# Patient Record
Sex: Male | Born: 1955 | Race: White | Hispanic: No | Marital: Married | Smoking: Never smoker
Health system: Southern US, Community
[De-identification: ages and names within clinical notes are randomized; demographics above are authoritative.]

## PROBLEM LIST (undated history)

## (undated) DIAGNOSIS — I10 Essential (primary) hypertension: Secondary | ICD-10-CM

## (undated) HISTORY — PX: COLONOSCOPY: SHX174

## (undated) HISTORY — PX: WISDOM TOOTH EXTRACTION: SHX21

---

## 2009-03-16 ENCOUNTER — Ambulatory Visit: Payer: Self-pay | Admitting: Gastroenterology

## 2009-10-26 ENCOUNTER — Emergency Department: Payer: Self-pay | Admitting: Emergency Medicine
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
## 2019-12-24 ENCOUNTER — Ambulatory Visit: Payer: Self-pay | Admitting: General Surgery

## 2019-12-24 ENCOUNTER — Other Ambulatory Visit: Payer: Self-pay

## 2019-12-24 ENCOUNTER — Encounter
Admission: RE | Admit: 2019-12-24 | Discharge: 2019-12-24 | Disposition: A | Discharge status: Home / Self Care | Payer: Commercial Managed Care - PPO | Source: Ambulatory Visit | Attending: General Surgery | Admitting: General Surgery

## 2019-12-24 HISTORY — DX: Essential (primary) hypertension: I10

## 2019-12-24 NOTE — H&P (Signed)
PATIENT PROFILE: Joseph Short is a 64 y.o. male who presents to the Clinic for consultation at the request of Dr. Baldemar Lenis for evaluation of left inguinal hernia.  PCP: Barnabas Lister, MD  HISTORY OF PRESENT ILLNESS: Joseph Short reports feeling a left inguinal hernia since November 2020. He reported that he was doing heavy lifting and he felt pain on the left groin. Since then he has been feeling pain and burning on the left groin. He also feels a bulge that comes in and out. Pain is aggravated by heavy lifting. Alleviating factors resting. There is no pain radiation. Patient denies any abdominal distention nausea or vomiting. Patient having regular bowel movements. Patient tolerating diet.  PROBLEM LIST: Problem List Date Reviewed: 12/05/2019  Noted  Cerebral amyloid angiopathy (CMS-HCC) 05/03/2018  Essential hypertension, benign 09/26/2013  Other and unspecified hyperlipidemia 09/26/2013    GENERAL REVIEW OF SYSTEMS:   General ROS: negative for - chills, fatigue, fever, weight gain or weight loss Allergy and Immunology ROS: negative for - hives  Hematological and Lymphatic ROS: negative for - bleeding problems or bruising, negative for palpable nodes Endocrine ROS: negative for - heat or cold intolerance, hair changes Respiratory ROS: negative for - cough, shortness of breath or wheezing Cardiovascular ROS: no chest pain or palpitations GI ROS: negative for nausea, vomiting, abdominal pain, diarrhea, constipation Musculoskeletal ROS: negative for - joint swelling or muscle pain Neurological ROS: negative for - confusion, syncope Dermatological ROS: negative for pruritus and rash Psychiatric: negative for anxiety, depression, difficulty sleeping and memory loss  MEDICATIONS: Current Outpatient Medications  Medication Sig Dispense Refill  . carBAMazepine (TEGRETOL XR) 400 MG XR tablet Take 1 tablet (400 mg total) by mouth 2 (two) times daily 60 tablet 11  . lisinopriL (ZESTRIL) 20  MG tablet TAKE 1 TABLET BY MOUTH EVERY DAY 90 tablet 3  . simvastatin (ZOCOR) 20 MG tablet TAKE 1 TABLET BY MOUTH EVERY DAY AT NIGHT 90 tablet 3   No current facility-administered medications for this visit.   ALLERGIES: Other  PAST MEDICAL HISTORY: Past Medical History:  Diagnosis Date  . Hyperlipidemia  . Hypertension   PAST SURGICAL HISTORY: Past Surgical History:  Procedure Laterality Date  . COLONOSCOPY 03/16/2009  Dr. Kurtis Bushman @ [place]: History reviewed. No pertinent family history.   SOCIAL HISTORY: Social History   Socioeconomic History  . Marital status: Married  Spouse name: Not on file  . Number of children: Not on file  . Years of education: Not on file  . Highest education level: Not on file  Occupational History  . Not on file  Tobacco Use  . Smoking status: Never Smoker  . Smokeless tobacco: Never Used  Substance and Sexual Activity  . Alcohol use: Not on file  . Drug use: Not on file  . Sexual activity: Not on file  Other Topics Concern  . Not on file  Social History Narrative  . Not on file   Social Determinants of Health   Financial Resource Strain:  . Difficulty of Paying Living Expenses:  Food Insecurity:  . Worried About Charity fundraiser in the Last Year:  . Arboriculturist in the Last Year:  Transportation Needs:  . Film/video editor (Medical):  Marland Kitchen Lack of Transportation (Non-Medical):   PHYSICAL EXAM: Vitals:  12/19/19 1523  BP: (!) 154/91  Pulse: 70   Body mass index is 28.98 kg/m. Weight: 91.6 kg (202 lb)  GENERAL: Alert, active, oriented x3  HEENT: Pupils equal reactive to light. Extraocular movements are intact. Sclera clear. Palpebral conjunctiva normal red color.Pharynx clear.  NECK: Supple with no palpable mass and no adenopathy.  LUNGS: Sound clear with no rales rhonchi or wheezes.  HEART: Regular rhythm S1 and S2 without murmur.  ABDOMEN: Soft and depressible, nontender with  no palpable mass, no hepatomegaly. There is a palpable hernia on the left groin, reducible. Mild pain to reduction. No hernia palpated on the right groin but mildly tender to palpation as well.  EXTREMITIES: Well-developed well-nourished symmetrical with no dependent edema.  NEUROLOGICAL: Awake alert oriented, facial expression symmetrical, moving all extremities.  REVIEW OF DATA: I have reviewed the following data today: Appointment on 11/28/2019  Component Date Value  . WBC (White Blood Cell Co* 11/28/2019 4.1  . RBC (Red Blood Cell Coun* 11/28/2019 5.09  . Hemoglobin 11/28/2019 15.4  . Hematocrit 11/28/2019 45.5  . MCV (Mean Corpuscular Vo* 11/28/2019 89.4  . MCH (Mean Corpuscular He* 11/28/2019 30.3  . MCHC (Mean Corpuscular H* 11/28/2019 33.8  . Platelet Count 11/28/2019 237  . RDW-CV (Red Cell Distrib* 11/28/2019 11.7  . MPV (Mean Platelet Volum* 11/28/2019 9.2*  . Neutrophils 11/28/2019 2.20  . Lymphocytes 11/28/2019 1.30  . Mixed Count 11/28/2019 0.60  . Neutrophil % 11/28/2019 54.9  . Lymphocyte % 11/28/2019 30.6  . Mixed % 11/28/2019 14.5*  . Glucose 11/28/2019 95  . Sodium 11/28/2019 139  . Potassium 11/28/2019 4.7  . Chloride 11/28/2019 104  . Carbon Dioxide (CO2) 11/28/2019 28.3  . Urea Nitrogen (BUN) 11/28/2019 19  . Creatinine 11/28/2019 1.0  . Glomerular Filtration Ra* 11/28/2019 75  . Calcium 11/28/2019 9.4  . AST 11/28/2019 19  . ALT 11/28/2019 21  . Alk Phos (alkaline Phosp* 11/28/2019 100  . Albumin 11/28/2019 4.5  . Bilirubin, Total 11/28/2019 0.6  . Protein, Total 11/28/2019 7.0  . A/G Ratio 11/28/2019 1.8  . Cholesterol, Total 11/28/2019 225*  . Triglyceride 11/28/2019 103  . HDL (High Density Lipopr* 11/28/2019 50.7  . LDL Calculated 11/28/2019 154*  . VLDL Cholesterol 11/28/2019 21  . Cholesterol/HDL Ratio 11/28/2019 4.4  . PSA (Prostate Specific A* 11/28/2019 0.62    ASSESSMENT: Joseph Short is a 64 y.o. male presenting for consultation for  left inguinal hernia.   The patient presents with a symptomatic, reducible inguinal hernia. Patient was oriented about the diagnosis of inguinal hernia and its implication. The patient was oriented about the treatment alternatives (observation vs surgical repair). Due to patient symptoms, repair is recommended. Patient oriented about the surgical procedure, the use of mesh and its risk of complications such as: infection, bleeding, injury to vas deference, vasculature and testicle, injury to bowel or bladder, and chronic pain.   Long discussion with the patient about the possibility of having bilateral hernia. I considered that if hernia is found Intra-Op it would be recommended to repair at the same time. Patient agreed with the recommendation and consented for right inguinal hernia repair if identified Intra-Op.  Non-recurrent unilateral inguinal hernia without obstruction or gangrene [K40.90]  PLAN: 1. Robotic assisted laparoscopic left vs bilateral inguinal hernia repair with mesh (70962) 2. CBC, CMP (done) 3. Avoid taking aspirin 5 days before procedure 4. Neurology Clearance 5. Contact us if has any question or concern.  Patient verbalized understanding, all questions were answered, and were agreeable with the plan outlined above.   Herbert Pun, MD  Electronically signed by Herbert Pun, MD

## 2019-12-24 NOTE — H&P (View-Only) (Signed)
PATIENT PROFILE: Joseph Short is a 64 y.o. male who presents to the Clinic for consultation at the request of Dr. Baldemar Lenis for evaluation of left inguinal hernia.  PCP: Barnabas Lister, MD  HISTORY OF PRESENT ILLNESS: [name] reports feeling a left inguinal hernia since November 2020. He reported that he was doing heavy lifting and he felt pain on the left groin. Since then he has been feeling pain and burning on the left groin. He also feels a bulge that comes in and out. Pain is aggravated by heavy lifting. Alleviating factors resting. There is no pain radiation. Patient denies any abdominal distention nausea or vomiting. Patient having regular bowel movements. Patient tolerating diet.  PROBLEM LIST: Problem List Date Reviewed: 12/05/2019  Noted  Cerebral amyloid angiopathy (CMS-HCC) 05/03/2018  Essential hypertension, benign 09/26/2013  Other and unspecified hyperlipidemia 09/26/2013    GENERAL REVIEW OF SYSTEMS:   General ROS: negative for - chills, fatigue, fever, weight gain or weight loss Allergy and Immunology ROS: negative for - hives  Hematological and Lymphatic ROS: negative for - bleeding problems or bruising, negative for palpable nodes Endocrine ROS: negative for - heat or cold intolerance, hair changes Respiratory ROS: negative for - cough, shortness of breath or wheezing Cardiovascular ROS: no chest pain or palpitations GI ROS: negative for nausea, vomiting, abdominal pain, diarrhea, constipation Musculoskeletal ROS: negative for - joint swelling or muscle pain Neurological ROS: negative for - confusion, syncope Dermatological ROS: negative for pruritus and rash Psychiatric: negative for anxiety, depression, difficulty sleeping and memory loss  MEDICATIONS: Current Outpatient Medications  Medication Sig Dispense Refill  . carBAMazepine (TEGRETOL XR) 400 MG XR tablet Take 1 tablet (400 mg total) by mouth 2 (two) times daily 60 tablet 11  . lisinopriL (ZESTRIL) 20  MG tablet TAKE 1 TABLET BY MOUTH EVERY DAY 90 tablet 3  . simvastatin (ZOCOR) 20 MG tablet TAKE 1 TABLET BY MOUTH EVERY DAY AT NIGHT 90 tablet 3   No current facility-administered medications for this visit.   ALLERGIES: Other  PAST MEDICAL HISTORY: Past Medical History:  Diagnosis Date  . Hyperlipidemia  . Hypertension   PAST SURGICAL HISTORY: Past Surgical History:  Procedure Laterality Date  . COLONOSCOPY 03/16/2009  Dr. Kurtis Bushman @ [place]: History reviewed. No pertinent family history.   SOCIAL HISTORY: Social History   Socioeconomic History  . Marital status: Married  Spouse name: Not on file  . Number of children: Not on file  . Years of education: Not on file  . Highest education level: Not on file  Occupational History  . Not on file  Tobacco Use  . Smoking status: Never Smoker  . Smokeless tobacco: Never Used  Substance and Sexual Activity  . Alcohol use: Not on file  . Drug use: Not on file  . Sexual activity: Not on file  Other Topics Concern  . Not on file  Social History Narrative  . Not on file   Social Determinants of Health   Financial Resource Strain:  . Difficulty of Paying Living Expenses:  Food Insecurity:  . Worried About Charity fundraiser in the Last Year:  . Arboriculturist in the Last Year:  Transportation Needs:  . Film/video editor (Medical):  Marland Kitchen Lack of Transportation (Non-Medical):   PHYSICAL EXAM: Vitals:  12/19/19 1523  BP: (!) 154/91  Pulse: 70   Body mass index is 28.98 kg/m. Weight: 91.6 kg (202 lb)  GENERAL: Alert, active, oriented x3  HEENT: Pupils equal reactive to light. Extraocular movements are intact. Sclera clear. Palpebral conjunctiva normal red color.Pharynx clear.  NECK: Supple with no palpable mass and no adenopathy.  LUNGS: Sound clear with no rales rhonchi or wheezes.  HEART: Regular rhythm S1 and S2 without murmur.  ABDOMEN: Soft and depressible, nontender with  no palpable mass, no hepatomegaly. There is a palpable hernia on the left groin, reducible. Mild pain to reduction. No hernia palpated on the right groin but mildly tender to palpation as well.  EXTREMITIES: Well-developed well-nourished symmetrical with no dependent edema.  NEUROLOGICAL: Awake alert oriented, facial expression symmetrical, moving all extremities.  REVIEW OF DATA: I have reviewed the following data today: Appointment on 11/28/2019  Component Date Value  . WBC (White Blood Cell Co* 11/28/2019 4.1  . RBC (Red Blood Cell Coun* 11/28/2019 5.09  . Hemoglobin 11/28/2019 15.4  . Hematocrit 11/28/2019 45.5  . MCV (Mean Corpuscular Vo* 11/28/2019 89.4  . MCH (Mean Corpuscular He* 11/28/2019 30.3  . MCHC (Mean Corpuscular H* 11/28/2019 33.8  . Platelet Count 11/28/2019 237  . RDW-CV (Red Cell Distrib* 11/28/2019 11.7  . MPV (Mean Platelet Volum* 11/28/2019 9.2*  . Neutrophils 11/28/2019 2.20  . Lymphocytes 11/28/2019 1.30  . Mixed Count 11/28/2019 0.60  . Neutrophil % 11/28/2019 54.9  . Lymphocyte % 11/28/2019 30.6  . Mixed % 11/28/2019 14.5*  . Glucose 11/28/2019 95  . Sodium 11/28/2019 139  . Potassium 11/28/2019 4.7  . Chloride 11/28/2019 104  . Carbon Dioxide (CO2) 11/28/2019 28.3  . Urea Nitrogen (BUN) 11/28/2019 19  . Creatinine 11/28/2019 1.0  . Glomerular Filtration Ra* 11/28/2019 75  . Calcium 11/28/2019 9.4  . AST 11/28/2019 19  . ALT 11/28/2019 21  . Alk Phos (alkaline Phosp* 11/28/2019 100  . Albumin 11/28/2019 4.5  . Bilirubin, Total 11/28/2019 0.6  . Protein, Total 11/28/2019 7.0  . A/G Ratio 11/28/2019 1.8  . Cholesterol, Total 11/28/2019 225*  . Triglyceride 11/28/2019 103  . HDL (High Density Lipopr* 11/28/2019 50.7  . LDL Calculated 11/28/2019 154*  . VLDL Cholesterol 11/28/2019 21  . Cholesterol/HDL Ratio 11/28/2019 4.4  . PSA (Prostate Specific A* 11/28/2019 0.62    ASSESSMENT: [name] is a 64 y.o. male presenting for consultation for  left inguinal hernia.   The patient presents with a symptomatic, reducible inguinal hernia. Patient was oriented about the diagnosis of inguinal hernia and its implication. The patient was oriented about the treatment alternatives (observation vs surgical repair). Due to patient symptoms, repair is recommended. Patient oriented about the surgical procedure, the use of mesh and its risk of complications such as: infection, bleeding, injury to vas deference, vasculature and testicle, injury to bowel or bladder, and chronic pain.   Long discussion with the patient about the possibility of having bilateral hernia. I considered that if hernia is found Intra-Op it would be recommended to repair at the same time. Patient agreed with the recommendation and consented for right inguinal hernia repair if identified Intra-Op.  Non-recurrent unilateral inguinal hernia without obstruction or gangrene [K40.90]  PLAN: 1. Robotic assisted laparoscopic left vs bilateral inguinal hernia repair with mesh ([id number]) 2. CBC, CMP (done) 3. Avoid taking aspirin 5 days before procedure 4. Neurology Clearance 5. Contact us if has any question or concern.  Patient verbalized understanding, all questions were answered, and were agreeable with the plan outlined above.   Herbert Pun, MD  Electronically signed by Herbert Pun, MD

## 2019-12-24 NOTE — Patient Instructions (Signed)
Your procedure is scheduled on: Friday December 27, 2019. Report to Day Surgery inside [facility] 2nd floor. To find out your arrival time please call [phone] between 1PM - 3PM on Thursday December 26, 2019.  Remember: Instructions that are not followed completely may result in serious medical risk,  up to and including death, or upon the discretion of your surgeon and anesthesiologist your  surgery may need to be rescheduled.     _X__ 1. Do not eat food after midnight the night before your procedure.                 No chewing gum or hard candies. You may drink clear liquids up to 2 hours                 before you are scheduled to arrive for your surgery- DO not drink clear                 liquids within 2 hours of the start of your surgery.                 Clear Liquids include:  water, apple juice without pulp, clear Gatorade, G2 or                  Gatorade Zero (avoid Red/Purple/Blue), Black Coffee or Tea (Do not add                 anything to coffee or tea).  __X__2.  On the morning of surgery brush your teeth with toothpaste and water, you                may rinse your mouth with mouthwash if you wish.  Do not swallow any toothpaste of mouthwash.     _X__ 3.  No Alcohol for 24 hours before or after surgery.   _X__ 4.  Do Not Smoke or use e-cigarettes For 24 Hours Prior to Your Surgery.                 Do not use any chewable tobacco products for at least 6 hours prior to                 Surgery.  _X__  5.  Do not use any recreational drugs (marijuana, cocaine, heroin, ecstasy, MDMA or other)                For at least one week prior to your surgery.  Combination of these drugs with anesthesia                May have life threatening results.  __x__ 6.  Notify your doctor if there is any change in your medical condition      (cold, fever, infections).     Do not wear jewelry, make-up, hairpins, clips or nail polish. Do not wear lotions,  powders, or perfumes. You may wear deodorant. Do not shave 48 hours prior to surgery. Men may shave face and neck. Do not bring valuables to the hospital.    [facility] is not responsible for any belongings or valuables.  Contacts, dentures or bridgework may not be worn into surgery. Leave your suitcase in the car. After surgery it may be brought to your room. For patients admitted to the hospital, discharge time is determined by your treatment team.  Patients discharged the day of surgery will not be allowed to drive home.   Make arrangements for someone to be with you for the first 24 hours of your Same Day Discharge.    __x__ Take these medicines the morning of surgery with A SIP OF WATER:    1.  carbamazepine (TEGRETOL XR) 400 MG   __x__ Use CHG Soap as directed  __x__ Stop Anti-inflammatories such as Ibuprofen, Aleve, naproxen, aspirin and or BC powders.    __x__ Stop supplements until after surgery.    __x__ Do not start any herbal supplements before your procedure.    If you have any questions regarding your pre-procedure instructions,  Please call Pre-admit Testing at [phone].

## 2019-12-25 ENCOUNTER — Other Ambulatory Visit: Payer: Commercial Managed Care - PPO

## 2019-12-25 ENCOUNTER — Encounter
Admission: RE | Admit: 2019-12-25 | Discharge: 2019-12-25 | Disposition: A | Discharge status: Home / Self Care | Payer: Commercial Managed Care - PPO | Source: Ambulatory Visit | Attending: General Surgery | Admitting: General Surgery

## 2019-12-25 DIAGNOSIS — Z20822 Contact with and (suspected) exposure to covid-19: Secondary | ICD-10-CM | POA: Insufficient documentation

## 2019-12-25 DIAGNOSIS — Z01818 Encounter for other preprocedural examination: Secondary | ICD-10-CM | POA: Insufficient documentation

## 2019-12-25 LAB — SARS CORONAVIRUS 2 (TAT 6-24 HRS): SARS Coronavirus 2: NEGATIVE

## 2019-12-27 ENCOUNTER — Ambulatory Visit: Payer: Commercial Managed Care - PPO | Admitting: Anesthesiology

## 2019-12-27 ENCOUNTER — Encounter
Admission: RE | Disposition: A | Discharge status: Home / Self Care | Payer: Self-pay | Source: Home / Self Care | Attending: General Surgery

## 2019-12-27 ENCOUNTER — Ambulatory Visit
Admission: RE | Admit: 2019-12-27 | Payer: Commercial Managed Care - PPO | Source: Home / Self Care | Admitting: General Surgery

## 2019-12-27 ENCOUNTER — Other Ambulatory Visit: Payer: Self-pay

## 2019-12-27 ENCOUNTER — Encounter: Payer: Self-pay | Admitting: General Surgery

## 2019-12-27 ENCOUNTER — Encounter: Admission: RE | Payer: Self-pay | Source: Home / Self Care

## 2019-12-27 ENCOUNTER — Ambulatory Visit
Admission: RE | Admit: 2019-12-27 | Discharge: 2019-12-27 | Disposition: A | Discharge status: Home / Self Care | Payer: Commercial Managed Care - PPO | Attending: General Surgery | Admitting: General Surgery

## 2019-12-27 DIAGNOSIS — K4020 Bilateral inguinal hernia, without obstruction or gangrene, not specified as recurrent: Secondary | ICD-10-CM | POA: Diagnosis not present

## 2019-12-27 DIAGNOSIS — D176 Benign lipomatous neoplasm of spermatic cord: Secondary | ICD-10-CM | POA: Insufficient documentation

## 2019-12-27 DIAGNOSIS — E785 Hyperlipidemia, unspecified: Secondary | ICD-10-CM | POA: Diagnosis not present

## 2019-12-27 DIAGNOSIS — I10 Essential (primary) hypertension: Secondary | ICD-10-CM | POA: Insufficient documentation

## 2019-12-27 DIAGNOSIS — I680 Cerebral amyloid angiopathy: Secondary | ICD-10-CM | POA: Insufficient documentation

## 2019-12-27 DIAGNOSIS — K4090 Unilateral inguinal hernia, without obstruction or gangrene, not specified as recurrent: Secondary | ICD-10-CM | POA: Diagnosis present

## 2019-12-27 DIAGNOSIS — E854 Organ-limited amyloidosis: Secondary | ICD-10-CM | POA: Insufficient documentation

## 2019-12-27 DIAGNOSIS — Z79899 Other long term (current) drug therapy: Secondary | ICD-10-CM | POA: Diagnosis not present

## 2019-12-27 HISTORY — PX: XI ROBOTIC ASSISTED INGUINAL HERNIA REPAIR WITH MESH: SHX6706

## 2019-12-27 SURGERY — REPAIR, HERNIA, INGUINAL, ROBOT-ASSISTED, LAPAROSCOPIC, USING MESH
Anesthesia: General | Site: Groin | Laterality: Bilateral

## 2019-12-27 MED ORDER — LIDOCAINE HCL (CARDIAC) PF 100 MG/5ML IV SOSY
PREFILLED_SYRINGE | INTRAVENOUS | Status: DC | PRN
  Administered 2019-12-27: 80 mg via INTRAVENOUS

## 2019-12-27 MED ORDER — KETOROLAC TROMETHAMINE 30 MG/ML IJ SOLN
INTRAMUSCULAR | Status: AC
  Filled 2019-12-27: qty 1

## 2019-12-27 MED ORDER — FAMOTIDINE 20 MG PO TABS
ORAL_TABLET | ORAL | Status: AC
  Administered 2019-12-27: 20 mg via ORAL
  Filled 2019-12-27: qty 1

## 2019-12-27 MED ORDER — CEFAZOLIN SODIUM-DEXTROSE 2-4 GM/100ML-% IV SOLN
INTRAVENOUS | Status: AC
  Filled 2019-12-27: qty 100

## 2019-12-27 MED ORDER — KETOROLAC TROMETHAMINE 30 MG/ML IJ SOLN
INTRAMUSCULAR | Status: DC | PRN
  Administered 2019-12-27: 30 mg via INTRAVENOUS

## 2019-12-27 MED ORDER — DEXAMETHASONE SODIUM PHOSPHATE 10 MG/ML IJ SOLN
INTRAMUSCULAR | Status: AC
  Filled 2019-12-27: qty 1

## 2019-12-27 MED ORDER — ONDANSETRON HCL 4 MG/2ML IJ SOLN: 4.0000 mg | Freq: Once | INTRAMUSCULAR | Status: DC | PRN

## 2019-12-27 MED ORDER — FENTANYL CITRATE (PF) 100 MCG/2ML IJ SOLN
INTRAMUSCULAR | Status: DC | PRN
Start: 2019-12-27 — End: 2019-12-27
  Administered 2019-12-27 (×2): 50 ug via INTRAVENOUS

## 2019-12-27 MED ORDER — ROCURONIUM BROMIDE 10 MG/ML (PF) SYRINGE
PREFILLED_SYRINGE | INTRAVENOUS | Status: AC
  Filled 2019-12-27: qty 10

## 2019-12-27 MED ORDER — ONDANSETRON HCL 4 MG/2ML IJ SOLN
INTRAMUSCULAR | Status: DC | PRN
  Administered 2019-12-27: 4 mg via INTRAVENOUS

## 2019-12-27 MED ORDER — CHLORHEXIDINE GLUCONATE 0.12 % MT SOLN: 15.0000 mL | Freq: Once | OROMUCOSAL | Status: AC

## 2019-12-27 MED ORDER — BUPIVACAINE-EPINEPHRINE (PF) 0.25% -1:200000 IJ SOLN
INTRAMUSCULAR | Status: AC
  Filled 2019-12-27: qty 30

## 2019-12-27 MED ORDER — ONDANSETRON HCL 4 MG/2ML IJ SOLN
INTRAMUSCULAR | Status: AC
  Filled 2019-12-27: qty 2

## 2019-12-27 MED ORDER — PROPOFOL 10 MG/ML IV BOLUS
INTRAVENOUS | Status: DC | PRN
  Administered 2019-12-27: 170 mg via INTRAVENOUS

## 2019-12-27 MED ORDER — PROPOFOL 10 MG/ML IV BOLUS
INTRAVENOUS | Status: AC
  Filled 2019-12-27: qty 20

## 2019-12-27 MED ORDER — HYDROCODONE-ACETAMINOPHEN 5-325 MG PO TABS
1.0000 | ORAL_TABLET | ORAL | 0 refills | Status: AC | PRN
Start: 2019-12-27 — End: 2019-12-30

## 2019-12-27 MED ORDER — EPHEDRINE 5 MG/ML INJ
INTRAVENOUS | Status: AC
  Filled 2019-12-27: qty 10

## 2019-12-27 MED ORDER — DEXMEDETOMIDINE HCL IN NACL 400 MCG/100ML IV SOLN
INTRAVENOUS | Status: DC | PRN
  Administered 2019-12-27: 8 ug via INTRAVENOUS
  Administered 2019-12-27 (×3): 4 ug via INTRAVENOUS

## 2019-12-27 MED ORDER — FAMOTIDINE 20 MG PO TABS: 20.0000 mg | ORAL_TABLET | Freq: Once | ORAL | Status: AC

## 2019-12-27 MED ORDER — DEXAMETHASONE SODIUM PHOSPHATE 10 MG/ML IJ SOLN
INTRAMUSCULAR | Status: DC | PRN
  Administered 2019-12-27: 10 mg via INTRAVENOUS

## 2019-12-27 MED ORDER — SUGAMMADEX SODIUM 200 MG/2ML IV SOLN
INTRAVENOUS | Status: DC | PRN
  Administered 2019-12-27: 200 mg via INTRAVENOUS

## 2019-12-27 MED ORDER — ACETAMINOPHEN 10 MG/ML IV SOLN
INTRAVENOUS | Status: AC
  Filled 2019-12-27: qty 100

## 2019-12-27 MED ORDER — ROCURONIUM BROMIDE 100 MG/10ML IV SOLN
INTRAVENOUS | Status: DC | PRN
  Administered 2019-12-27: 50 mg via INTRAVENOUS
  Administered 2019-12-27 (×2): 20 mg via INTRAVENOUS
  Administered 2019-12-27: 30 mg via INTRAVENOUS
  Administered 2019-12-27 (×2): 20 mg via INTRAVENOUS

## 2019-12-27 MED ORDER — CEFAZOLIN SODIUM-DEXTROSE 2-4 GM/100ML-% IV SOLN
2.0000 g | INTRAVENOUS | Status: AC
  Administered 2019-12-27: 2 g via INTRAVENOUS

## 2019-12-27 MED ORDER — DEXMEDETOMIDINE (PRECEDEX) IN NS 20 MCG/5ML (4 MCG/ML) IV SYRINGE
PREFILLED_SYRINGE | INTRAVENOUS | Status: AC
  Filled 2019-12-27: qty 5

## 2019-12-27 MED ORDER — MIDAZOLAM HCL 2 MG/2ML IJ SOLN
INTRAMUSCULAR | Status: AC
  Filled 2019-12-27: qty 2

## 2019-12-27 MED ORDER — CHLORHEXIDINE GLUCONATE 0.12 % MT SOLN
OROMUCOSAL | Status: AC
  Administered 2019-12-27: 15 mL via OROMUCOSAL
  Filled 2019-12-27: qty 15

## 2019-12-27 MED ORDER — BUPIVACAINE-EPINEPHRINE 0.25% -1:200000 IJ SOLN
INTRAMUSCULAR | Status: DC | PRN
  Administered 2019-12-27: 16 mL
  Administered 2019-12-27: 14 mL

## 2019-12-27 MED ORDER — ORAL CARE MOUTH RINSE: 15.0000 mL | Freq: Once | OROMUCOSAL | Status: AC

## 2019-12-27 MED ORDER — FENTANYL CITRATE (PF) 100 MCG/2ML IJ SOLN
INTRAMUSCULAR | Status: AC
  Filled 2019-12-27: qty 2

## 2019-12-27 MED ORDER — SEVOFLURANE IN SOLN
RESPIRATORY_TRACT | Status: AC
  Filled 2019-12-27: qty 250

## 2019-12-27 MED ORDER — LIDOCAINE HCL (PF) 2 % IJ SOLN
INTRAMUSCULAR | Status: AC
  Filled 2019-12-27: qty 5

## 2019-12-27 MED ORDER — MIDAZOLAM HCL 2 MG/2ML IJ SOLN
INTRAMUSCULAR | Status: DC | PRN
  Administered 2019-12-27: 2 mg via INTRAVENOUS

## 2019-12-27 MED ORDER — EPHEDRINE SULFATE 50 MG/ML IJ SOLN
INTRAMUSCULAR | Status: DC | PRN
  Administered 2019-12-27 (×3): 10 mg via INTRAVENOUS

## 2019-12-27 MED ORDER — FENTANYL CITRATE (PF) 100 MCG/2ML IJ SOLN: 25.0000 ug | INTRAMUSCULAR | Status: DC | PRN

## 2019-12-27 MED ORDER — LACTATED RINGERS IV SOLN: INTRAVENOUS | Status: DC

## 2019-12-27 MED ORDER — ACETAMINOPHEN 10 MG/ML IV SOLN
INTRAVENOUS | Status: DC | PRN
  Administered 2019-12-27: 1000 mg via INTRAVENOUS

## 2019-12-27 SURGICAL SUPPLY — 60 items
ADH SKN CLS APL DERMABOND .7 (GAUZE/BANDAGES/DRESSINGS) ×1
APL PRP FLT STRL LF DISP 70% (MISCELLANEOUS)
APL PRP STRL LF DISP 70% ISPRP (MISCELLANEOUS) ×1
APPLICATOR CHLORAPREP 10 TEAL (MISCELLANEOUS) IMPLANT
BAG INFUSER PRESSURE 100CC (MISCELLANEOUS) IMPLANT
BLADE SURG SZ11 CARB STEEL (BLADE) ×3 IMPLANT
CANISTER SUCT 1200ML W/VALVE (MISCELLANEOUS) ×3 IMPLANT
CANNULA REDUC XI 12-8 STAPL (CANNULA) ×1
CANNULA REDUC XI 12-8MM STAPL (CANNULA) ×1
CANNULA REDUCER 12-8 DVNC XI (CANNULA) ×1 IMPLANT
CHLORAPREP W/TINT 26 (MISCELLANEOUS) ×3 IMPLANT
COVER LIGHT HANDLE STERIS (MISCELLANEOUS) ×6 IMPLANT
COVER TIP SHEARS 8 DVNC (MISCELLANEOUS) ×1 IMPLANT
COVER TIP SHEARS 8MM DA VINCI (MISCELLANEOUS) ×2
COVER WAND RF STERILE (DRAPES) ×6 IMPLANT
DEFOGGER SCOPE WARMER CLEARIFY (MISCELLANEOUS) ×3 IMPLANT
DERMABOND ADVANCED (GAUZE/BANDAGES/DRESSINGS) ×2
DERMABOND ADVANCED .7 DNX12 (GAUZE/BANDAGES/DRESSINGS) ×1 IMPLANT
DRAPE ARM DVNC X/XI (DISPOSABLE) ×3 IMPLANT
DRAPE COLUMN DVNC XI (DISPOSABLE) ×1 IMPLANT
DRAPE DA VINCI XI ARM (DISPOSABLE) ×6
DRAPE DA VINCI XI COLUMN (DISPOSABLE) ×2
ELECT REM PT RETURN 9FT ADLT (ELECTROSURGICAL) ×3
ELECTRODE REM PT RTRN 9FT ADLT (ELECTROSURGICAL) ×1 IMPLANT
GLOVE BIO SURGEON STRL SZ 6.5 (GLOVE) ×4 IMPLANT
GLOVE BIO SURGEONS STRL SZ 6.5 (GLOVE) ×2
GLOVE BIOGEL PI IND STRL 6.5 (GLOVE) ×2 IMPLANT
GLOVE BIOGEL PI INDICATOR 6.5 (GLOVE) ×4
GOWN STRL REUS W/ TWL LRG LVL3 (GOWN DISPOSABLE) ×3 IMPLANT
GOWN STRL REUS W/TWL LRG LVL3 (GOWN DISPOSABLE) ×9
GRASPER SUT TROCAR 14GX15 (MISCELLANEOUS) ×3 IMPLANT
IRRIGATOR SUCT 8 DISP DVNC XI (IRRIGATION / IRRIGATOR) IMPLANT
IRRIGATOR SUCTION 8MM XI DISP (IRRIGATION / IRRIGATOR)
IV CATH ANGIO 12GX3 LT BLUE (NEEDLE) ×3 IMPLANT
IV NS 1000ML (IV SOLUTION)
IV NS 1000ML BAXH (IV SOLUTION) IMPLANT
KIT PINK PAD W/HEAD ARE REST (MISCELLANEOUS) ×3
KIT PINK PAD W/HEAD ARM REST (MISCELLANEOUS) ×1 IMPLANT
LABEL OR SOLS (LABEL) IMPLANT
MESH 3DMAX 4X6 RT LRG (Mesh General) ×3 IMPLANT
MESH 3DMAX 5X7 LT XLRG (Mesh General) ×3 IMPLANT
NEEDLE HYPO 22GX1.5 SAFETY (NEEDLE) ×3 IMPLANT
NEEDLE INSUFFLATION 14GA 120MM (NEEDLE) ×3 IMPLANT
OBTURATOR OPTICAL STANDARD 8MM (TROCAR) ×2
OBTURATOR OPTICAL STND 8 DVNC (TROCAR) ×1
OBTURATOR OPTICALSTD 8 DVNC (TROCAR) ×1 IMPLANT
PACK LAP CHOLECYSTECTOMY (MISCELLANEOUS) ×3 IMPLANT
SEAL CANN UNIV 5-8 DVNC XI (MISCELLANEOUS) ×3 IMPLANT
SEAL XI 5MM-8MM UNIVERSAL (MISCELLANEOUS) ×6
SET TUBE SMOKE EVAC HIGH FLOW (TUBING) ×3 IMPLANT
SOLUTION ELECTROLUBE (MISCELLANEOUS) ×3 IMPLANT
STAPLER CANNULA SEAL DVNC XI (STAPLE) ×1 IMPLANT
STAPLER CANNULA SEAL XI (STAPLE) ×2
SUT MNCRL AB 4-0 PS2 18 (SUTURE) ×6 IMPLANT
SUT VIC AB 2-0 SH 27 (SUTURE) ×3
SUT VIC AB 2-0 SH 27XBRD (SUTURE) ×1 IMPLANT
SUT VICRYL 0 AB UR-6 (SUTURE) ×3 IMPLANT
SUT VLOC 90 S/L VL9 GS22 (SUTURE) ×6 IMPLANT
TAPE TRANSPORE STRL 2 31045 (GAUZE/BANDAGES/DRESSINGS) IMPLANT
TRAY FOLEY MTR SLVR 16FR STAT (SET/KITS/TRAYS/PACK) ×3 IMPLANT

## 2019-12-27 NOTE — Anesthesia Procedure Notes (Signed)
Procedure Name: Intubation Date/Time: 12/27/2019 12:19 PM Performed by: Jerrye Noble, CRNA Pre-anesthesia Checklist: Patient identified, Emergency Drugs available, Suction available and Patient being monitored Patient Re-evaluated:Patient Re-evaluated prior to induction Oxygen Delivery Method: Circle system utilized Preoxygenation: Pre-oxygenation with 100% oxygen Induction Type: IV induction Ventilation: Mask ventilation without difficulty Laryngoscope Size: McGraph and 4 Grade View: Grade I Tube type: Oral Tube size: 7.5 mm Number of attempts: 1 Airway Equipment and Method: Stylet and Video-laryngoscopy Placement Confirmation: ETT inserted through vocal cords under direct vision,  positive ETCO2 and breath sounds checked- equal and bilateral Secured at: 23 cm Tube secured with: Tape Dental Injury: Teeth and Oropharynx as per pre-operative assessment

## 2019-12-27 NOTE — Discharge Instructions (Addendum)
  Diet: Resume home heart healthy regular diet.   Activity: No heavy lifting >20 pounds (children, pets, laundry, garbage) or strenuous activity until follow-up, but light activity and walking are encouraged. Do not drive or drink alcohol if taking narcotic pain medications.  Wound care: May shower with soapy water and pat dry (do not rub incisions), but no baths or submerging incision underwater until follow-up. (no swimming)   Medications: Resume all home medications. For mild to moderate pain: acetaminophen (Tylenol) or ibuprofen (if no kidney disease). Combining Tylenol with alcohol can substantially increase your risk of causing liver disease. Narcotic pain medications, if prescribed, can be used for severe pain, though may cause nausea, constipation, and drowsiness. Do not combine Tylenol and Norco within a 6 hour period as Norco contains Tylenol. If you do not need the narcotic pain medication, you do not need to fill the prescription.  Call office ([phone]) at any time if any questions, worsening pain, fevers/chills, bleeding, drainage from incision site, or other concerns.   AMBULATORY SURGERY  DISCHARGE INSTRUCTIONS   1) The drugs that you were given will stay in your system until tomorrow so for the next 24 hours you should not:  A) Drive an automobile B) Make any legal decisions C) Drink any alcoholic beverage   2) You may resume regular meals tomorrow.  Today it is better to start with liquids and gradually work up to solid foods.  You may eat anything you prefer, but it is better to start with liquids, then soup and crackers, and gradually work up to solid foods.   3) Please notify your doctor immediately if you have any unusual bleeding, trouble breathing, redness and pain at the surgery site, drainage, fever, or pain not relieved by medication.    4) Additional Instructions:        Please contact your physician with any problems or Same Day Surgery at  [phone], Monday through Friday 6 am to 4 pm, or [facility] Main number at [phone]. 

## 2019-12-27 NOTE — Anesthesia Preprocedure Evaluation (Signed)
Anesthesia Evaluation  Patient identified by MRN, date of birth, ID band Patient awake    Reviewed: Allergy & Precautions, NPO status , Patient's Chart, lab work & pertinent test results  History of Anesthesia Complications Negative for: history of anesthetic complications  Airway Mallampati: II       Dental   Pulmonary neg sleep apnea, neg COPD, Not current smoker,           Cardiovascular hypertension, Pt. on medications (-) Past MI and (-) CHF (-) dysrhythmias (-) Valvular Problems/Murmurs     Neuro/Psych neg Seizures    GI/Hepatic Neg liver ROS, neg GERD  ,  Endo/Other  neg diabetes  Renal/GU negative Renal ROS     Musculoskeletal   Abdominal   Peds  Hematology   Anesthesia Other Findings   Reproductive/Obstetrics                             Anesthesia Physical Anesthesia Plan  ASA: II  Anesthesia Plan: General   Post-op Pain Management:    Induction: Intravenous  PONV Risk Score and Plan: 2 and Ondansetron and Dexamethasone  Airway Management Planned: Oral ETT  Additional Equipment:   Intra-op Plan:   Post-operative Plan:   Informed Consent: I have reviewed the patients History and Physical, chart, labs and discussed the procedure including the risks, benefits and alternatives for the proposed anesthesia with the patient or authorized representative who has indicated his/her understanding and acceptance.       Plan Discussed with:   Anesthesia Plan Comments:         Anesthesia Quick Evaluation  

## 2019-12-27 NOTE — Op Note (Signed)
Preoperative diagnosis: Bilateral inguinal hernia.   Postoperative diagnosis: Bilateral inguinal hernia.  Procedure: Robotic assisted Laparoscopic Transabdominal preperitoneal laparoscopic (TAPP) repair of bilateral inguinal hernia.  Anesthesia: GETA  Surgeon: Dr. Windell Moment  Wound Classification: Clean  Indications:  Patient is a 64 y.o. male developed a symptomatic bilateral inguinal hernia. Repair was indicated.  Findings: 1. Bilateral indirect Inguinal hernia identified 2. Vas deferens and cord structures identified and preserved 3. Extra large Bard 3D Max mesh used for repair of left inguinal hernia     Large Bard 3D Max mesh used for repair of right inguinal hernia 4. Adequate hemostasis.   Left inguinal hernia   Right inguinal hernia    Description of procedure: The patient was taken to the operating room and the correct side of surgery was verified. The patient was placed supine with arms tucked at the sides. After obtaining adequate anesthesia, the patient's abdomen was prepped and draped in standard sterile fashion. The patient was placed in the Trendelenburg position. A time-out was completed verifying correct patient, procedure, site, positioning, and implant(s) and/or special equipment prior to beginning this procedure. A Veress needle was placed at the umbilicus and pneumoperitoneum created with insufflation of carbon dioxide to 15 mmHg. After the Veress needle was removed, an 8-mm trocar was placed on epigastric area and the 30 angled laparoscope inserted. Two 8-mm trocars were then placed lateral to the rectus sheath under direct visualization. Both inguinal regions were inspected and the median umbilical ligament, medial umbilical ligament, and lateral umbilical fold were identified.  The robotic arms were docked. The robotic scope was inserted and the pelvic area anatomy targeted.  Left inguinal hernia repaired first as it was the side of most symptoms. The  peritoneum was incised with scissors along a line 5 cm above the superior edge of the hernia defect, extending from the median umbilical ligament to the anterior superior iliac spine. The peritoneal flap was mobilized inferiorly using blunt and sharp dissection. The inferior epigastric vessels were exposed and the pubic symphysis was identified. Cooper's ligament was dissected to its junction with the iliac vein. The dissection was continued inferiorly to the iliopubic tract, with care taken to avoid injury to the femoral branch of the genitofemoral nerve and the lateral femoral cutaneous nerve. The cord structures were parietalized. The hernia was identified and reduced by gentle traction.  The hernia sac hernia sac and large lipoma were noted mobilized from the cord structures and reduced into the peritoneal cavity. The left upper quadrant 8 mm trocar was exchanged to a 12 mm trocar to be able to introduce the Extra-large mesh.  An  Extra-large piece of mesh was rolled longitudinally into a compact cylinder and passed through a trocar. The cylinder was placed along the inferior aspect of the working space and unrolled into place to completely cover the direct, indirect, and femoral spaces. The mesh was secured into place superiorly to the anterior abdominal wall and inferiorly and medially to Cooper's ligament with absorbable sutures. Care was taken to avoid the inferolateral triangles containing the iliac vessels and genital nerves. The peritoneal flap was closed over the mesh and secured with suture in similar positions of safety.  The right indirect inguinal hernia was repaired using the same technique. The mesh used to repair the right hernia was a Large Bard 3D Max. A large cord lipoma was reduced completely as well.  After ensuring adequate hemostasis, the trocars were removed and the pneumoperitoneum allowed to escape. The trocar incisions were  closed using monocryl and skin adhesive dressings applied.    The patient tolerated the procedure well and was taken to the postanesthesia care unit in stable condition.   Specimen: None  Complications: None  Estimated Blood Loss: 5 mL

## 2019-12-27 NOTE — Transfer of Care (Signed)
Immediate Anesthesia Transfer of Care Note  Patient: Joseph Short  Procedure(s) Performed: XI ROBOTIC ASSISTED INGUINAL HERNIA REPAIR WITH MESH BILATERAL (Bilateral Groin)  Patient Location: PACU  Anesthesia Type:General  Level of Consciousness: awake and drowsy  Airway & Oxygen Therapy: Patient Spontanous Breathing and Patient connected to face mask oxygen  Post-op Assessment: Report given to RN and Post -op Vital signs reviewed and stable  Post vital signs: Reviewed and stable  Last Vitals:  Vitals Value Taken Time  BP 139/67 12/27/19 1516  Temp 36.1 C 12/27/19 1516  Pulse 70 12/27/19 1517  Resp 23 12/27/19 1517  SpO2 100 % 12/27/19 1517  Vitals shown include unvalidated device data.  Last Pain:  Vitals:   12/27/19 0909  TempSrc: Temporal  PainSc: 3          Complications: No complications documented.

## 2019-12-27 NOTE — Interval H&P Note (Signed)
History and Physical Interval Note:  12/27/2019 10:01 AM  Joseph Short  has presented today for surgery, with the diagnosis of K40.90 non recurrent unilateral vs bilateral inguinal hernia w/o obstruiction or gangrene.  The various methods of treatment have been discussed with the patient and family. After consideration of risks, benefits and other options for treatment, the patient has consented to  Procedure(s): XI ROBOTIC [name] (Bilateral) as a surgical intervention.  The patient's history has been reviewed, patient examined, no change in status, stable for surgery.  I have reviewed the patient's chart and labs.  Questions were answered to the patient's satisfaction.     Herbert Pun

## 2019-12-27 NOTE — Anesthesia Postprocedure Evaluation (Signed)
Anesthesia Post Note  Patient: Joseph Short  Procedure(s) Performed: XI ROBOTIC ASSISTED INGUINAL HERNIA REPAIR WITH MESH BILATERAL (Bilateral Groin)  Patient location during evaluation: PACU Anesthesia Type: General Level of consciousness: awake and alert Pain management: pain level controlled Vital Signs Assessment: post-procedure vital signs reviewed and stable Respiratory status: spontaneous breathing, nonlabored ventilation and respiratory function stable Cardiovascular status: blood pressure returned to baseline and stable Postop Assessment: no apparent nausea or vomiting Anesthetic complications: no   No complications documented.   Last Vitals:  Vitals:   12/27/19 1604 12/27/19 1620  BP: (!) 152/77 (!) 150/80  Pulse: 72 78  Resp: 16 16  Temp: (!) 36.1 C   SpO2: 100% 99%    Last Pain:  Vitals:   12/27/19 1620  TempSrc:   PainSc: 0-No pain                 Brett Canales Janene Yousuf

## 2019-12-30 ENCOUNTER — Encounter: Payer: Self-pay | Admitting: General Surgery

## 2021-11-11 DIAGNOSIS — G40109 Localization-related (focal) (partial) symptomatic epilepsy and epileptic syndromes with simple partial seizures, not intractable, without status epilepticus: Secondary | ICD-10-CM | POA: Diagnosis not present

## 2021-11-11 DIAGNOSIS — E854 Organ-limited amyloidosis: Secondary | ICD-10-CM | POA: Diagnosis not present

## 2021-11-11 DIAGNOSIS — I680 Cerebral amyloid angiopathy: Secondary | ICD-10-CM | POA: Diagnosis not present

## 2021-11-11 DIAGNOSIS — Z6829 Body mass index (BMI) 29.0-29.9, adult: Secondary | ICD-10-CM | POA: Diagnosis not present

## 2022-01-08 ENCOUNTER — Encounter: Payer: Self-pay | Admitting: Emergency Medicine

## 2022-01-08 ENCOUNTER — Emergency Department
Admission: EM | Admit: 2022-01-08 | Discharge: 2022-01-08 | Disposition: A | Discharge status: Home / Self Care | Payer: Commercial Managed Care - PPO | Attending: Emergency Medicine | Admitting: Emergency Medicine

## 2022-01-08 ENCOUNTER — Other Ambulatory Visit: Payer: Self-pay

## 2022-01-08 ENCOUNTER — Emergency Department: Payer: Commercial Managed Care - PPO

## 2022-01-08 DIAGNOSIS — Z043 Encounter for examination and observation following other accident: Secondary | ICD-10-CM | POA: Diagnosis not present

## 2022-01-08 DIAGNOSIS — R55 Syncope and collapse: Secondary | ICD-10-CM | POA: Diagnosis not present

## 2022-01-08 LAB — BASIC METABOLIC PANEL
Anion gap: 5 (ref 5–15)
BUN: 22 mg/dL (ref 8–23)
CO2: 25 mmol/L (ref 22–32)
Calcium: 8.7 mg/dL — ABNORMAL LOW (ref 8.9–10.3)
Chloride: 102 mmol/L (ref 98–111)
Creatinine, Ser: 0.96 mg/dL (ref 0.61–1.24)
GFR, Estimated: >60 mL/min (ref >60)
Glucose, Bld: 99 mg/dL (ref 70–99)
Potassium: 4.7 mmol/L (ref 3.5–5.1)
Sodium: 132 mmol/L — ABNORMAL LOW (ref 135–145)

## 2022-01-08 LAB — URINALYSIS, ROUTINE W REFLEX MICROSCOPIC
Bilirubin Urine: NEGATIVE
Glucose, UA: NEGATIVE mg/dL
Hgb urine dipstick: NEGATIVE
Ketones, ur: NEGATIVE mg/dL
Leukocytes,Ua: NEGATIVE
Nitrite: NEGATIVE
Protein, ur: NEGATIVE mg/dL
Specific Gravity, Urine: 1.024 (ref 1.005–1.030)
pH: 5 (ref 5.0–8.0)

## 2022-01-08 LAB — CBC
HCT: 42.9 % (ref 39.0–52.0)
Hemoglobin: 14.9 g/dL (ref 13.0–17.0)
MCH: 30.5 pg (ref 26.0–34.0)
MCHC: 34.7 g/dL (ref 30.0–36.0)
MCV: 87.7 fL (ref 80.0–100.0)
Platelets: 253 10*3/uL (ref 150–400)
RBC: 4.89 MIL/uL (ref 4.22–5.81)
RDW: 11.9 % (ref 11.5–15.5)
WBC: 6.1 10*3/uL (ref 4.0–10.5)
nRBC: 0 % (ref 0.0–0.2)

## 2022-01-08 LAB — CBG MONITORING, ED: Glucose-Capillary: 84 mg/dL (ref 70–99)

## 2022-01-08 NOTE — ED Triage Notes (Signed)
Pt BIB EMS for syncope that lasted about 1-2 minutes.    128/82 72 95% on RA CBG 92

## 2022-01-08 NOTE — Discharge Instructions (Signed)
Please seek medical attention for any high fevers, chest pain, shortness of breath, change in behavior, persistent vomiting, bloody stool or any other new or concerning symptoms.  

## 2022-01-08 NOTE — ED Provider Notes (Signed)
[facility] Provider Note    Event Date/Time   First MD Initiated Contact with Patient 01/08/22 1847     (approximate)   History   Loss of Consciousness   HPI  Joseph Short is a 66 y.o. male  who presents to the emergency department today because of concern for a syncopal episode. The patient was at his sons wedding and had been standing beside him during the ceremony. He started feeling like he was going to pass out and tried to get to the front row of seats but did not make it. Passed out onto grass. He says he had not hydrated well earlier in the day. Denies having any chest pain, palpitations prior to passing out. Denies any recent illness. The patient states that he does feel better at the time of my exam.       Physical Exam   Triage Vital Signs: ED Triage Vitals  Enc Vitals Group     BP 01/08/22 1751 (!) 144/88     Pulse Rate 01/08/22 1751 72     Resp 01/08/22 1751 12     Temp 01/08/22 1751 98.4 F (36.9 C)     Temp Source 01/08/22 1751 Oral     SpO2 01/08/22 1751 97 %     Weight 01/08/22 1752 202 lb (91.6 kg)     Height 01/08/22 1752 '5\' 11"'$  (1.803 m)     Head Circumference --      Peak Flow --      Pain Score 01/08/22 1751 0     Pain Loc --      Pain Edu? --      Excl. in [place]? --     Most recent vital signs: Vitals:   01/08/22 1751  BP: (!) 144/88  Pulse: 72  Resp: 12  Temp: 98.4 F (36.9 C)  SpO2: 97%   General: Awake, alert, oriented. CV:  Good peripheral perfusion. Regular rate and rhythm. Resp:  Normal effort. Lungs clear. Abd:  No distention.    ED Results / Procedures / Treatments   Labs (all labs ordered are listed, but only abnormal results are displayed) Labs Reviewed  BASIC METABOLIC PANEL - Abnormal; Notable for the following components:      Result Value   Sodium 132 (*)    Calcium 8.7 (*)    All other components within normal limits  URINALYSIS, ROUTINE W REFLEX MICROSCOPIC - Abnormal; Notable for the  following components:   Color, Urine YELLOW (*)    APPearance HAZY (*)    All other components within normal limits  CBC  CBG MONITORING, ED  CBG MONITORING, ED     EKG  I, Nance Pear, attending physician, personally viewed and interpreted this EKG  EKG Time: 1754 Rate: 74 Rhythm: normal sinus rhythm Axis: normal Intervals: qtc 399 QRS: narrow ST changes: no st elevation Impression: normal ekg  RADIOLOGY I independently interpreted and visualized the ct head. My interpretation: No bleed Radiology interpretation:  IMPRESSION:  1. No acute intracranial abnormalities.     PROCEDURES:  Critical Care performed: No  Procedures   MEDICATIONS ORDERED IN ED: Medications - No data to display   IMPRESSION / MDM / [place] / ED COURSE  I reviewed the triage vital signs and the nursing notes.                              Differential  diagnosis includes, but is not limited to, vasovagal, orthostatic, arrhythmia, anemia, electrolyte abnormality.  Patient's presentation is most consistent with acute presentation with potential threat to life or bodily function.  Patient presented to the emergency department today after a syncopal episode at his sons wedding. Blood work with hyponatremia. EKG without concerning findings. Head CT without any concerning intracranial process. At this time have low suspicion for cardiac etiology. Have low suspicion for PE. Do think likely probably combination of vasovagal and dehydration. Did offer to give IV fluids however patient felt comfortable with discharge and oral hydration. I think this is reasonable. Discussed follow up with PCP and return precautions.    FINAL CLINICAL IMPRESSION(S) / ED DIAGNOSES   Final diagnoses:  Syncope, unspecified syncope type    Note:  This document was prepared using Dragon voice recognition software and may include unintentional dictation errors.    Nance Pear, MD 01/08/22 [id number]

## 2022-01-08 NOTE — ED Triage Notes (Signed)
Pt presents to ER via EMS. Pt came from an event pt reports he was standing and passed out. Pt reports he has not been drinking enough water today as his son was getting married and was busy with all the wedding preparations. Pt has some abrasions to his forehead. Pt talks in complete sentences no respiratory distress noted.

## 2022-01-08 NOTE — ED Notes (Signed)
Pt AOX4, ambulatory without difficulty.

## 2022-01-13 ENCOUNTER — Telehealth: Payer: Self-pay

## 2022-01-13 NOTE — Telephone Encounter (Signed)
     Patient  visit on 10/7  at [place]   Have you been able to follow up with your primary care physician? YES  The patient was or was not able to obtain any needed medicine or equipment. YES  Are there diet recommendations that you are having difficulty following? NA  Patient expresses understanding of discharge instructions and education provided has no other needs at this time. [place], [facility], Care Management  [phone], [place], [place] [address] Phone: [phone] Email: [email or web address]

## 2022-01-27 DIAGNOSIS — Z79899 Other long term (current) drug therapy: Secondary | ICD-10-CM | POA: Diagnosis not present

## 2022-01-27 DIAGNOSIS — Z125 Encounter for screening for malignant neoplasm of prostate: Secondary | ICD-10-CM | POA: Diagnosis not present

## 2022-01-27 DIAGNOSIS — E7800 Pure hypercholesterolemia, unspecified: Secondary | ICD-10-CM | POA: Diagnosis not present

## 2022-02-10 DIAGNOSIS — E7800 Pure hypercholesterolemia, unspecified: Secondary | ICD-10-CM | POA: Diagnosis not present

## 2022-02-10 DIAGNOSIS — I10 Essential (primary) hypertension: Secondary | ICD-10-CM | POA: Diagnosis not present

## 2022-02-10 DIAGNOSIS — Z0000 Encounter for general adult medical examination without abnormal findings: Secondary | ICD-10-CM | POA: Diagnosis not present

## 2022-02-10 DIAGNOSIS — Z79899 Other long term (current) drug therapy: Secondary | ICD-10-CM | POA: Diagnosis not present

## 2022-02-10 DIAGNOSIS — Z1331 Encounter for screening for depression: Secondary | ICD-10-CM | POA: Diagnosis not present

## 2023-02-27 ENCOUNTER — Other Ambulatory Visit: Payer: Self-pay | Admitting: Neurology

## 2023-02-27 DIAGNOSIS — R531 Weakness: Secondary | ICD-10-CM

## 2023-02-27 DIAGNOSIS — G40909 Epilepsy, unspecified, not intractable, without status epilepticus: Secondary | ICD-10-CM

## 2023-02-27 DIAGNOSIS — R200 Anesthesia of skin: Secondary | ICD-10-CM

## 2023-03-05 ENCOUNTER — Ambulatory Visit
Admission: RE | Admit: 2023-03-05 | Discharge: 2023-03-05 | Disposition: A | Discharge status: Home / Self Care | Payer: Medicare Other | Source: Ambulatory Visit | Attending: Neurology | Admitting: Neurology

## 2023-03-05 DIAGNOSIS — R531 Weakness: Secondary | ICD-10-CM | POA: Diagnosis present

## 2023-03-05 DIAGNOSIS — G40909 Epilepsy, unspecified, not intractable, without status epilepticus: Secondary | ICD-10-CM | POA: Diagnosis present

## 2023-03-05 DIAGNOSIS — R200 Anesthesia of skin: Secondary | ICD-10-CM | POA: Diagnosis present
